# Patient Record
Sex: Female | Born: 1995
Health system: Southern US, Community
[De-identification: ages and names within clinical notes are randomized; demographics above are authoritative.]

## PROBLEM LIST (undated history)

## (undated) DIAGNOSIS — F32A Depression, unspecified: Secondary | ICD-10-CM

## (undated) DIAGNOSIS — F419 Anxiety disorder, unspecified: Secondary | ICD-10-CM

## (undated) HISTORY — DX: Anxiety disorder, unspecified: F41.9

## (undated) HISTORY — PX: EXTERNAL EAR SURGERY: SHX627

---
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
## 2014-09-18 ENCOUNTER — Ambulatory Visit (INDEPENDENT_AMBULATORY_CARE_PROVIDER_SITE_OTHER): Payer: BC Managed Care – PPO | Admitting: Family Medicine

## 2014-09-18 VITALS — BP 114/64 | HR 103 | Temp 98.7°F | Resp 16 | Ht 64.25 in | Wt 116.8 lb

## 2014-09-18 DIAGNOSIS — R599 Enlarged lymph nodes, unspecified: Secondary | ICD-10-CM

## 2014-09-18 DIAGNOSIS — R591 Generalized enlarged lymph nodes: Secondary | ICD-10-CM

## 2014-09-18 MED ORDER — CEPHALEXIN 500 MG PO CAPS: 500.0000 mg | ORAL_CAPSULE | Freq: Two times a day (BID) | ORAL | Status: DC

## 2014-09-18 NOTE — Patient Instructions (Signed)

## 2014-09-18 NOTE — Progress Notes (Signed)
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 Comes in with her recently found nodule on the occipital part of her scalp, left side. Is nontender. Aref patient denies any rash on her scalp although she has mild facial acne. She also denies any ear pain, neck pain, fever, night sweats, or weight loss.  [place] Her father runs a [employer] company. Her mother was murdered earlier this year by ex-boyfriend. He says that she's doing okay now and is not depressed.  Objective: No acute distress 1/2 cm firm nodule which is fixed and nonfluctuant over the left aspect of her occipital scalp. There is no other adenopathy in the head or neck. Ears are normal in appearance and with otoscopic exam. Oropharynx clear Skin: No rashes other than very mild acne on her face.  Assessment: No red flags for serious causes of lymphadenopathy.  Plan: Keflex 500 twice a day x10 days. Patient told to come back if new nodules appear or she begins to feel systemically ill.  Signed, Sheila OatsKurt Nilda Keathley M.D.

## 2014-09-24 ENCOUNTER — Ambulatory Visit (INDEPENDENT_AMBULATORY_CARE_PROVIDER_SITE_OTHER): Payer: BC Managed Care – PPO | Admitting: Family Medicine

## 2014-09-24 VITALS — BP 102/60 | HR 103 | Temp 98.0°F | Resp 16 | Ht 64.0 in | Wt 118.0 lb

## 2014-09-24 DIAGNOSIS — T887XXA Unspecified adverse effect of drug or medicament, initial encounter: Secondary | ICD-10-CM

## 2014-09-24 DIAGNOSIS — R590 Localized enlarged lymph nodes: Secondary | ICD-10-CM

## 2014-09-24 DIAGNOSIS — R599 Enlarged lymph nodes, unspecified: Secondary | ICD-10-CM

## 2014-09-24 DIAGNOSIS — T50905A Adverse effect of unspecified drugs, medicaments and biological substances, initial encounter: Secondary | ICD-10-CM

## 2014-09-24 NOTE — Progress Notes (Signed)
  Andrea Salazar - 18 y.o. female MRN [id number]  Date of birth: [birth year]  SUBJECTIVE:  Including CC & ROS.  patient C/O: diarrhea some mild stomach cramps Onset of symptoms:  2 days Symptoms: Patient is seen last week on 09/18/14 complaining of left-sided lymphadenopathy at the posterior cervical region. This was felt to be related to patient's history of acne as a localized lymphadenopathy. Patient was started on Keflex for the past week after completing 5 days of medication she said to 3 days of mild abdominal cramping and diarrhea. Patient in denies any vaginal symptoms or dysuria. She also reports that her lymphadenopathy has resolved and wants to know if she can discontinue medication. Relieving factors: nothing Worsened by: noting eating normally   ROS:  Constitutional:  No fever, chills, or fatigue.  Respiratory:  No shortness of breath, cough, or wheezing Cardiovascular:  No palpitations, chest pain or syncope Gastrointestinal:  No nausea, no abdominal pain Review of systems otherwise negative except for what is stated in HPI  HISTORY: Past Medical, Surgical, Social, and Family History Reviewed & Updated per EMR. 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   HENT:  Normocephalic, Oral mucosa is moist.   Respiratory:  Lungs are clear to auscultation, Respirations are non-labored, Symmetrical chest wall expansion.   Cardiovascular:  Normal rate, Regular rhythm, No murmur, Good pulses equal in all extremities, No edema.   Gastrointestinal:  Soft, Non-tender, Non-distended, Normal bowel sounds, No organomegaly.   Integumentary:  Warm, Dry, No rash.  No notable lymphadenopathy in the posterior cervical, anterior  cervical, submandibular, or posterior regular region Neurologic:  Alert, Oriented, No focal defects Psychiatric:  Cooperative, Appropriate mood & affect.    ASSESSMENT & PLAN:  Lymphadenopathy: Has resolved. Medication side effect: Patient had side effects from medication with Keflex of diarrhea. No signs of true allergy. Recommended discontinuing medication at this point. Recommended starting probiotic over-the-counter to help readjust her GI flora.

## 2015-06-03 ENCOUNTER — Ambulatory Visit (INDEPENDENT_AMBULATORY_CARE_PROVIDER_SITE_OTHER): Payer: BLUE CROSS/BLUE SHIELD | Admitting: Emergency Medicine

## 2015-06-03 VITALS — BP 110/60 | HR 70 | Temp 98.5°F | Resp 18 | Ht 64.5 in | Wt 128.8 lb

## 2015-06-03 DIAGNOSIS — N3001 Acute cystitis with hematuria: Secondary | ICD-10-CM | POA: Diagnosis not present

## 2015-06-03 DIAGNOSIS — M25559 Pain in unspecified hip: Secondary | ICD-10-CM

## 2015-06-03 LAB — POCT URINALYSIS DIPSTICK
BILIRUBIN UA: NEGATIVE
Glucose, UA: NEGATIVE
KETONES UA: NEGATIVE
Nitrite, UA: NEGATIVE
PH UA: 6.5
Protein, UA: NEGATIVE
Spec Grav, UA: <=1.005
Urobilinogen, UA: 0.2

## 2015-06-03 LAB — POCT UA - MICROSCOPIC ONLY
CASTS, UR, LPF, POC: NEGATIVE
CRYSTALS, UR, HPF, POC: NEGATIVE
MUCUS UA: NEGATIVE
YEAST UA: NEGATIVE

## 2015-06-03 LAB — POCT WET PREP WITH KOH
KOH Prep POC: NEGATIVE
Trichomonas, UA: NEGATIVE
WBC WET PREP PER HPF POC: NEGATIVE
YEAST WET PREP PER HPF POC: NEGATIVE

## 2015-06-03 LAB — POCT URINE PREGNANCY: Preg Test, Ur: NEGATIVE

## 2015-06-03 MED ORDER — CIPROFLOXACIN HCL 500 MG PO TABS: 500.0000 mg | ORAL_TABLET | Freq: Two times a day (BID) | ORAL | Status: DC

## 2015-06-03 MED ORDER — PHENAZOPYRIDINE HCL 200 MG PO TABS: 200.0000 mg | ORAL_TABLET | Freq: Three times a day (TID) | ORAL | Status: DC | PRN

## 2015-06-03 NOTE — Progress Notes (Signed)
Subjective:  Patient ID: Andrea Salazar, female    DOB: [birth year]  Age: 19 y.o. MRN: [id number]  CC: Urinary Tract Infection and Back Pain   HPI Andrea Salazar presents  with lower abdominal and back pain. She said that it feels like when she had her last urinary tract infection. She has no dysuria urgency or frequency. She has no vaginal discharge. She has no dyspareunia. Her last menstrual appears normal. Currently on oral contraceptive. Denies any nausea vomiting or fever chills no stool change.  History Oscar has a past medical history of Anxiety.   She has past surgical history that includes External ear surgery.   Her  family history is not on file.  She   reports that she has never smoked. She has never used smokeless tobacco. She reports that she does not drink alcohol or use illicit drugs.  Outpatient Prescriptions Prior to Visit  Medication Sig Dispense Refill  . dextroamphetamine (DEXEDRINE SPANSULE) 15 MG 24 hr capsule Take 15 mg by mouth daily.    . Norgestimate-Ethinyl Estradiol Triphasic (ORTHO TRI-CYCLEN LO) 0.18/0.215/0.25 MG-25 MCG tablet Take 1 tablet by mouth daily.       No facility-administered medications prior to visit.    History   Social History  . Marital Status: Single    Spouse Name: N/A  . Number of Children: N/A  . Years of Education: N/A   Social History Main Topics  . Smoking status: Never Smoker   . Smokeless tobacco: Never Used  . Alcohol Use: No  . Drug Use: No  . Sexual Activity: Not on file   Other Topics Concern  . None   Social History Narrative     Review of Systems  Constitutional: Negative for fever, chills and appetite change.  HENT: Negative for congestion, ear pain, postnasal drip, sinus pressure and sore throat.   Eyes: Negative for pain and redness.  Respiratory: Negative for cough, shortness of breath and wheezing.   Cardiovascular: Negative for leg swelling.  Gastrointestinal: Negative for nausea,  vomiting, abdominal pain, diarrhea, constipation and blood in stool.  Endocrine: Negative for polyuria.  Genitourinary: Negative for dysuria, urgency, frequency and flank pain.  Musculoskeletal: Negative for gait problem.  Skin: Negative for rash.  Neurological: Negative for weakness and headaches.  Psychiatric/Behavioral: Negative for confusion and decreased concentration. The patient is not nervous/anxious.     Objective:  BP 110/60 mmHg  Pulse 70  Temp(Src) 98.5 F (36.9 C) (Oral)  Resp 18  Ht 5' 4.5" (1.638 m)  Wt 128 lb 12.8 oz (58.423 kg)  BMI 21.77 kg/m2  SpO2 98%  LMP 05/18/2015  Physical Exam  Constitutional: She is oriented to person, place, and time. She appears well-developed and well-nourished. No distress.  HENT:  Head: Normocephalic and atraumatic.  Right Ear: External ear normal.  Left Ear: External ear normal.  Nose: Nose normal.  Eyes: Conjunctivae and EOM are normal. Pupils are equal, round, and reactive to light. No scleral icterus.  Neck: Normal range of motion. Neck supple. No tracheal deviation present.  Cardiovascular: Normal rate, regular rhythm and normal heart sounds.   Pulmonary/Chest: Effort normal. No respiratory distress. She has no wheezes. She has no rales.  Abdominal: She exhibits no mass. There is no tenderness. There is no rebound and no guarding.  Musculoskeletal: She exhibits no edema.  Lymphadenopathy:    She has no cervical adenopathy.  Neurological: She is alert and oriented to person, place, and time. Coordination normal.  Skin:  Skin is warm and dry. No rash noted.  Psychiatric: She has a normal mood and affect. Her behavior is normal.      Assessment & Plan:   Colin MuldersBrianna was seen today for urinary tract infection and back pain.  Diagnoses and all orders for this visit:  Pain in joint, pelvic region and thigh, unspecified laterality Orders: -     POCT UA - Microscopic Only -     POCT urinalysis dipstick -     POCT urine  pregnancy -     POCT Wet Prep with KOH -     GC/Chlamydia Probe Amp   I am having Ms. Yannuzzi maintain her Norgestimate-Ethinyl Estradiol Triphasic and dextroamphetamine.  No orders of the defined types were placed in this encounter.    Appropriate red flag conditions were discussed with the patient as well as actions that should be taken.  Patient expressed his understanding.  Follow-up: No Follow-up on file.  Carmelina DaneAnderson, Johaan Ryser S, MD    Results for orders placed or performed in visit on 06/03/15  POCT UA - Microscopic Only  Result Value Ref Range   WBC, Ur, HPF, POC 3-5    RBC, urine, microscopic 2-4    Bacteria, U Microscopic 1+    Mucus, UA neg    Epithelial cells, urine per micros 2-4    Crystals, Ur, HPF, POC neg    Casts, Ur, LPF, POC neg    Yeast, UA neg   POCT urinalysis dipstick  Result Value Ref Range   Color, UA yellow    Clarity, UA clear    Glucose, UA neg    Bilirubin, UA neg    Ketones, UA neg    Spec Grav, UA <=1.005    Blood, UA large    pH, UA 6.5    Protein, UA neg    Urobilinogen, UA 0.2    Nitrite, UA neg    Leukocytes, UA moderate (2+) (A) Negative  POCT urine pregnancy  Result Value Ref Range   Preg Test, Ur Negative Negative  POCT Wet Prep with KOH  Result Value Ref Range   Trichomonas, UA Negative    Clue Cells Wet Prep HPF POC 1-3    Epithelial Wet Prep HPF POC Many Few, Moderate, Many   Yeast Wet Prep HPF POC neg    Bacteria Wet Prep HPF POC Moderate (A) Few   RBC Wet Prep HPF POC 0-2    WBC Wet Prep HPF POC neg    KOH Prep POC Negative

## 2015-06-03 NOTE — Patient Instructions (Signed)

## 2015-06-09 LAB — GC/CHLAMYDIA PROBE AMP
CT PROBE, AMP APTIMA: NEGATIVE
GC Probe RNA: NEGATIVE

## 2015-07-02 ENCOUNTER — Ambulatory Visit (INDEPENDENT_AMBULATORY_CARE_PROVIDER_SITE_OTHER): Payer: BLUE CROSS/BLUE SHIELD | Admitting: Physician Assistant

## 2015-07-02 VITALS — BP 112/60 | HR 80 | Temp 97.9°F | Resp 16 | Ht 65.0 in | Wt 127.2 lb

## 2015-07-02 DIAGNOSIS — R1030 Lower abdominal pain, unspecified: Secondary | ICD-10-CM | POA: Diagnosis not present

## 2015-07-02 DIAGNOSIS — N898 Other specified noninflammatory disorders of vagina: Secondary | ICD-10-CM

## 2015-07-02 DIAGNOSIS — A499 Bacterial infection, unspecified: Secondary | ICD-10-CM | POA: Diagnosis not present

## 2015-07-02 DIAGNOSIS — R350 Frequency of micturition: Secondary | ICD-10-CM

## 2015-07-02 DIAGNOSIS — Z708 Other sex counseling: Secondary | ICD-10-CM | POA: Diagnosis not present

## 2015-07-02 DIAGNOSIS — Z7251 High risk heterosexual behavior: Secondary | ICD-10-CM | POA: Diagnosis not present

## 2015-07-02 DIAGNOSIS — B379 Candidiasis, unspecified: Secondary | ICD-10-CM

## 2015-07-02 DIAGNOSIS — N760 Acute vaginitis: Secondary | ICD-10-CM

## 2015-07-02 DIAGNOSIS — B9689 Other specified bacterial agents as the cause of diseases classified elsewhere: Secondary | ICD-10-CM

## 2015-07-02 DIAGNOSIS — IMO0001 Reserved for inherently not codable concepts without codable children: Secondary | ICD-10-CM

## 2015-07-02 LAB — POCT WET PREP WITH KOH
KOH PREP POC: POSITIVE
TRICHOMONAS UA: NEGATIVE

## 2015-07-02 LAB — POCT URINALYSIS DIPSTICK
Bilirubin, UA: NEGATIVE
Blood, UA: NEGATIVE
GLUCOSE UA: NEGATIVE
LEUKOCYTES UA: NEGATIVE
Nitrite, UA: NEGATIVE
PROTEIN UA: NEGATIVE
SPEC GRAV UA: 1.025
UROBILINOGEN UA: 0.2
pH, UA: 6.5

## 2015-07-02 LAB — POCT UA - MICROSCOPIC ONLY
CASTS, UR, LPF, POC: NEGATIVE
Crystals, Ur, HPF, POC: NEGATIVE
MUCUS UA: NEGATIVE
RBC, urine, microscopic: NEGATIVE
WBC, Ur, HPF, POC: NEGATIVE
Yeast, UA: NEGATIVE

## 2015-07-02 LAB — POCT URINE PREGNANCY: PREG TEST UR: NEGATIVE

## 2015-07-02 MED ORDER — METRONIDAZOLE 500 MG PO TABS: 500.0000 mg | ORAL_TABLET | Freq: Two times a day (BID) | ORAL | Status: AC

## 2015-07-02 MED ORDER — FLUCONAZOLE 150 MG PO TABS: 150.0000 mg | ORAL_TABLET | Freq: Once | ORAL | Status: DC

## 2015-07-02 NOTE — Patient Instructions (Signed)
Candida Infection A Candida infection (also called yeast, fungus, and Monilia infection) is an overgrowth of yeast that can occur anywhere on the body. A yeast infection commonly occurs in warm, moist body areas. Usually, the infection remains localized but can spread to become a systemic infection. A yeast infection may be a sign of a more severe disease such as diabetes, leukemia, or AIDS. A yeast infection can occur in both men and women. In women, Candida vaginitis is a vaginal infection. It is one of the most common causes of vaginitis. Men usually do not have symptoms or know they have an infection until other problems develop. Men may find out they have a yeast infection because their sex partner has a yeast infection. Uncircumcised men are more likely to get a yeast infection than circumcised men. This is because the uncircumcised glans is not exposed to air and does not remain as dry as that of a circumcised glans. Older adults may develop yeast infections around dentures. CAUSES  Women  Antibiotics.  Steroid medication taken for a long time.  Being overweight (obese).  Diabetes.  Poor immune condition.  Certain serious medical conditions.  Immune suppressive medications for organ transplant patients.  Chemotherapy.  Pregnancy.  Menstruation.  Stress and fatigue.  Intravenous drug use.  Oral contraceptives.  Wearing tight-fitting clothes in the crotch area.  Catching it from a sex partner who has a yeast infection.  Spermicide.  Intravenous, urinary, or other catheters. Men  Catching it from a sex partner who has a yeast infection.  Having oral or anal sex with a person who has the infection.  Spermicide.  Diabetes.  Antibiotics.  Poor immune system.  Medications that suppress the immune system.  Intravenous drug use.  Intravenous, urinary, or other catheters. SYMPTOMS  Women  Thick, white vaginal discharge.  Vaginal itching.  Redness and  swelling in and around the vagina.  Irritation of the lips of the vagina and perineum.  Blisters on the vaginal lips and perineum.  Painful sexual intercourse.  Low blood sugar (hypoglycemia).  Painful urination.  Bladder infections.  Intestinal problems such as constipation, indigestion, bad breath, bloating, increase in gas, diarrhea, or loose stools. Men  Men may develop intestinal problems such as constipation, indigestion, bad breath, bloating, increase in gas, diarrhea, or loose stools.  Dry, cracked skin on the penis with itching or discomfort.  Jock itch.  Dry, flaky skin.  Athlete's foot.  Hypoglycemia. DIAGNOSIS  Women  A history and an exam are performed.  The discharge may be examined under a microscope.  A culture may be taken of the discharge. Men  A history and an exam are performed.  Any discharge from the penis or areas of cracked skin will be looked at under the microscope and cultured.  Stool samples may be cultured. TREATMENT  Women  Vaginal antifungal suppositories and creams.  Medicated creams to decrease irritation and itching on the outside of the vagina.  Warm compresses to the perineal area to decrease swelling and discomfort.  Oral antifungal medications.  Medicated vaginal suppositories or cream for repeated or recurrent infections.  Wash and dry the irritation areas before applying the cream.  Eating yogurt with Lactobacillus may help with prevention and treatment.  Sometimes painting the vagina with gentian violet solution may help if creams and suppositories do not work. Men  Antifungal creams and oral antifungal medications.  Sometimes treatment must continue for 30 days after the symptoms go away to prevent recurrence. HOME CARE INSTRUCTIONS    Women °· Use cotton underwear and avoid tight-fitting clothing. °· Avoid colored, scented toilet paper and deodorant tampons or pads. °· Do not douche. °· Keep your diabetes  under control. °· Finish all the prescribed medications. °· Keep your skin clean and dry. °· Consume milk or yogurt with Lactobacillus-active culture regularly. If you get frequent yeast infections and think that is what the infection is, there are over-the-counter medications that you can get. If the infection does not show healing in 3 days, talk to your caregiver. °· Tell your sex partner you have a yeast infection. Your partner may need treatment also, especially if your infection does not clear up or recurs. °Men °· Keep your skin clean and dry. °· Keep your diabetes under control. °· Finish all prescribed medications. °· Tell your sex partner that you have a yeast infection so he or she can be treated if necessary. °SEEK MEDICAL CARE IF:  °· Your symptoms do not clear up or worsen in one week after treatment. °· You have an oral temperature above 102° F (38.9° C). °· You have trouble swallowing or eating for a prolonged time. °· You develop blisters on and around your vagina. °· You develop vaginal bleeding and it is not your menstrual period. °· You develop abdominal pain. °· You develop intestinal problems as mentioned above. °· You get weak or light-headed. °· You have painful or increased urination. °· You have pain during sexual intercourse. °MAKE SURE YOU:  °· Understand these instructions. °· Will watch your condition. °· Will get help right away if you are not doing well or get worse. °Document Released: 12/29/2004 Document Revised: 04/07/2014 Document Reviewed: 04/12/2010 °ExitCare® Patient Information ©2015 [employer]. This information is not intended to replace advice given to you by your health care provider. Make sure you discuss any questions you have with your health care provider. °Bacterial Vaginosis °Bacterial vaginosis is an infection of the vagina. It happens when too many of certain germs (bacteria) grow in the vagina. °HOME CARE °· Take your medicine as told by your doctor. °· Finish  your medicine even if you start to feel better. °· Do not have sex until you finish your medicine and are better. °· Tell your sex partner that you have an infection. They should see their doctor for treatment. °· Practice safe sex. Use condoms. Have only one sex partner. °GET HELP IF: °· You are not getting better after 3 days of treatment. °· You have more grey fluid (discharge) coming from your vagina than before. °· You have more pain than before. °· You have a fever. °MAKE SURE YOU:  °· Understand these instructions. °· Will watch your condition. °· Will get help right away if you are not doing well or get worse. °Document Released: 08/30/2008 Document Revised: 09/11/2013 Document Reviewed: 07/03/2013 °ExitCare® Patient Information ©2015 [employer]. This information is not intended to replace advice given to you by your health care provider. Make sure you discuss any questions you have with your health care provider. ° °

## 2015-07-02 NOTE — Progress Notes (Signed)
[facility] [address], [place] [address] [phone]  Date:  07/02/2015   Name:  Andrea Salazar   DOB:  Oct 27, 1996   MRN:  [id number]  PCP:  Frederica Kuster, MD    History of Present Illness:  Andrea Salazar is a 19 y.o. female patient who presents to [facility] for chief complaint of pelvic pain, vaginal bleeding, and frequency for 3 days.  Patient noticed that she is urinating a lot, though there is no dysuria.  She has also noticed pelvic pain.  There is some low back pain.  She also noticed abnormal vaginal discharge.  It is yellow with musty odor.  She used a qtip at her vaginal area where bloody discharge was obtained.  She has no n/v, fever, or fatigue.  There is no change in BMs.  She is sexually active, unprotected.  She was dxd with uti 1 month ago and treated with cipro.  Her sxs resolved completely.  LMP was 06/15/2015.    There are no active problems to display for this patient.   Past Medical History  Diagnosis Date  . Anxiety     Past Surgical History  Procedure Laterality Date  . External ear surgery      History  Substance Use Topics  . Smoking status: Never Smoker   . Smokeless tobacco: Never Used  . Alcohol Use: No    History reviewed. No pertinent family history.  No Known Allergies  Medication list has been reviewed and updated.  Current Outpatient Prescriptions on File Prior to Visit  Medication Sig Dispense Refill  . dextroamphetamine (DEXEDRINE SPANSULE) 15 MG 24 hr capsule Take 15 mg by mouth daily.    . Norgestimate-Ethinyl Estradiol Triphasic (ORTHO TRI-CYCLEN LO) 0.18/0.215/0.25 MG-25 MCG tablet Take 1 tablet by mouth daily.      . ciprofloxacin (CIPRO) 500 MG tablet Take 1 tablet (500 mg total) by mouth 2 (two) times daily. (Patient not taking: Reported on 07/02/2015) 6 tablet 0  . phenazopyridine (PYRIDIUM) 200 MG tablet Take 1 tablet (200 mg total) by mouth 3 (three) times daily as needed. (Patient not taking: Reported on  07/02/2015) 6 tablet 0   No current facility-administered medications on file prior to visit.    ROS   Physical Examination: BP 112/60 mmHg  Pulse 80  Temp(Src) 97.9 F (36.6 C) (Oral)  Resp 16  Ht 5\' 5"  (1.651 m)  Wt 127 lb 3.2 oz (57.698 kg)  BMI 21.17 kg/m2  SpO2 97%  LMP 06/15/2015 Ideal Body Weight: Weight in (lb) to have BMI = 25: 149.9  Physical Exam Alert, cooperative, oriented 4 in no acute distress. Lungs sounds are normal without wheezing or rhonchi. Regular rate and rhythm without murmurs or rubs. Bowel sounds are normal and belly is soft. There is lower quadrant pain and suprapubic pain with palpation.  CVA tenderness at the right side only.  Labia normal without rash.  Vaginal walls appear normal., difficult to have speculum inserted due to restriction.    Results for orders placed or performed in visit on 07/02/15  POCT Wet Prep with KOH  Result Value Ref Range   Trichomonas, UA Negative    Clue Cells Wet Prep HPF POC Moderate    Epithelial Wet Prep HPF POC Many Few, Moderate, Many   Yeast Wet Prep HPF POC Positve    Bacteria Wet Prep HPF POC Many (A) None, Few   RBC Wet Prep HPF POC 0-1    WBC  Wet Prep HPF POC 0-2    KOH Prep POC Positive   POCT urinalysis dipstick  Result Value Ref Range   Color, UA Amber    Clarity, UA Slightly Cloudy    Glucose, UA Negative    Bilirubin, UA Negative    Ketones, UA Trace    Spec Grav, UA 1.025    Blood, UA Negative    pH, UA 6.5    Protein, UA Negative    Urobilinogen, UA 0.2    Nitrite, UA Negative    Leukocytes, UA Negative Negative  POCT UA - Microscopic Only  Result Value Ref Range   WBC, Ur, HPF, POC Negative    RBC, urine, microscopic Negative    Bacteria, U Microscopic trace    Mucus, UA Negative    Epithelial cells, urine per micros 0-1    Crystals, Ur, HPF, POC Negative    Casts, Ur, LPF, POC Negative    Yeast, UA Negative   POCT urine pregnancy  Result Value Ref Range   Preg Test, Ur Negative  Negative    Assessment and Plan: 19 year old female is here today for chief complaint of abnormal discharge, abdominal pain, frequency, and vaginal bleeding.   Cervix unable to be visualized due to patient discomfort. We have obtained a dirty catch of urine to do a gonorrhea chlamydia, however wet prep is significant for yeast and BV. Prescribed metronidazole and Diflucan at this time. She will return if symptoms continue.  Discussed STD and sexual activity counseling.   Bacterial vaginosis - Plan: metroNIDAZOLE (FLAGYL) 500 MG tablet  Frequency - Plan: POCT urinalysis dipstick, POCT UA - Microscopic Only, GC/Chlamydia Probe Amp  Lower abdominal pain - Plan: POCT Wet Prep with KOH, POCT urine pregnancy, metroNIDAZOLE (FLAGYL) 500 MG tablet  Foul smelling vaginal discharge - Plan: POCT Wet Prep with KOH, metroNIDAZOLE (FLAGYL) 500 MG tablet, GC/Chlamydia Probe Amp  Yeast infection - Plan: fluconazole (DIFLUCAN) 150 MG tablet  Unprotected sex - Plan: GC/Chlamydia Probe Amp  Sexually transmitted disease counseling  Trena Platt, PA-C [facility] 7/28/20166:54 PM

## 2015-07-03 LAB — GC/CHLAMYDIA PROBE AMP
CT Probe RNA: NEGATIVE
GC PROBE AMP APTIMA: NEGATIVE

## 2017-05-16 DIAGNOSIS — Z01419 Encounter for gynecological examination (general) (routine) without abnormal findings: Secondary | ICD-10-CM | POA: Diagnosis not present

## 2017-05-16 DIAGNOSIS — R87610 Atypical squamous cells of undetermined significance on cytologic smear of cervix (ASC-US): Secondary | ICD-10-CM | POA: Diagnosis not present

## 2017-05-16 DIAGNOSIS — Z113 Encounter for screening for infections with a predominantly sexual mode of transmission: Secondary | ICD-10-CM | POA: Diagnosis not present

## 2017-05-16 DIAGNOSIS — Z6823 Body mass index (BMI) 23.0-23.9, adult: Secondary | ICD-10-CM | POA: Diagnosis not present

## 2017-05-16 DIAGNOSIS — Z23 Encounter for immunization: Secondary | ICD-10-CM | POA: Diagnosis not present

## 2017-07-18 DIAGNOSIS — Z23 Encounter for immunization: Secondary | ICD-10-CM | POA: Diagnosis not present

## 2017-09-12 ENCOUNTER — Ambulatory Visit (INDEPENDENT_AMBULATORY_CARE_PROVIDER_SITE_OTHER): Payer: BLUE CROSS/BLUE SHIELD | Admitting: Physician Assistant

## 2017-09-12 ENCOUNTER — Encounter: Payer: Self-pay | Admitting: Physician Assistant

## 2017-09-12 VITALS — BP 98/60 | HR 115 | Temp 98.4°F | Resp 18 | Ht 65.0 in | Wt 142.2 lb

## 2017-09-12 DIAGNOSIS — B9789 Other viral agents as the cause of diseases classified elsewhere: Secondary | ICD-10-CM

## 2017-09-12 DIAGNOSIS — Z23 Encounter for immunization: Secondary | ICD-10-CM | POA: Diagnosis not present

## 2017-09-12 DIAGNOSIS — J069 Acute upper respiratory infection, unspecified: Secondary | ICD-10-CM

## 2017-09-12 MED ORDER — AZELASTINE HCL 0.1 % NA SOLN: 2.0000 | Freq: Two times a day (BID) | NASAL | 0 refills | Status: AC

## 2017-09-12 MED ORDER — GUAIFENESIN ER 1200 MG PO TB12: 1.0000 | ORAL_TABLET | Freq: Two times a day (BID) | ORAL | 1 refills | Status: AC | PRN

## 2017-09-12 NOTE — Progress Notes (Signed)
Subjective:    Patient ID: Andrea Salazar, female    DOB: Apr 11, 1996, 21 y.o.   MRN: [id number]  HPI Chief Complaint  Patient presents with  . Sinus Problem    x1 day, pt states she is having sinus pressure with a sore throat and a slight headache. Pt states not ear pain or pressure.    Patient presents today for evaluation of a sore throat and frontal headache that began yesterday. She states it hurts to swallow and has some nasal congestion. She reports being "always tired". She denies any ear pain, fever, chills, . She has a slight cough, but it is not productive. She is leaving in 2 weeks for [place], and will be gone for 3 months.   Review of Systems  Constitutional: Negative for fever.  HENT: Positive for postnasal drip, rhinorrhea, sinus pain, sinus pressure and sore throat. Negative for ear discharge and ear pain.   Gastrointestinal: Negative for nausea and vomiting.  Musculoskeletal: Positive for neck pain.  Neurological: Positive for headaches. Negative for dizziness and light-headedness.   There are no active problems to display for this patient.  Prior to Admission medications   Medication Sig Start Date End Date Taking? Authorizing Provider  Norgestimate-Ethinyl Estradiol Triphasic (ORTHO TRI-CYCLEN LO) 0.18/0.215/0.25 MG-25 MCG tablet Take 1 tablet by mouth daily.     Yes [provider]  azelastine (ASTELIN) 0.1 % nasal spray Place 2 sprays into both nostrils 2 (two) times daily. Use in each nostril as directed 09/12/17   Porfirio Oar, PA-C  dextroamphetamine (DEXEDRINE SPANSULE) 15 MG 24 hr capsule Take 15 mg by mouth daily.    [provider]  Guaifenesin (MUCINEX MAXIMUM STRENGTH) 1200 MG TB12 Take 1 tablet (1,200 mg total) by mouth every 12 (twelve) hours as needed. 09/12/17   Porfirio Oar, PA-C   No Known Allergies Social History   Social History  . Marital status: Single    Spouse name: N/A  . Number of children: N/A  . Years of  education: N/A   Occupational History  . Not on file.   Social History Main Topics  . Smoking status: Never Smoker  . Smokeless tobacco: Never Used  . Alcohol use No  . Drug use: No  . Sexual activity: Not on file   Other Topics Concern  . Not on file   Social History Narrative  . No narrative on file      Objective:   Physical Exam  Constitutional: She is oriented to person, place, and time. She appears well-developed and well-nourished.  HENT:  Head: Normocephalic and atraumatic.  Right Ear: External ear normal.  Left Ear: External ear normal.  Nose: Mucosal edema present. Right sinus exhibits maxillary sinus tenderness and frontal sinus tenderness. Left sinus exhibits maxillary sinus tenderness and frontal sinus tenderness.  Neck: Neck supple. No JVD present. No tracheal deviation present. No thyromegaly present.  Cardiovascular: Normal rate, regular rhythm and intact distal pulses.  Exam reveals no gallop and no friction rub.   No murmur heard. Pulmonary/Chest: Effort normal and breath sounds normal. No stridor. No respiratory distress. She has no wheezes. She has no rales. She exhibits no tenderness.  Lymphadenopathy:    She has no cervical adenopathy.  Neurological: She is alert and oriented to person, place, and time.  Skin: Skin is warm and dry. No rash noted. No erythema. No pallor.      Assessment & Plan:  1. Need for Tdap vaccination - Completed today in clinic -  Tdap vaccine greater than or equal to 7yo IM  2. Flu vaccine need - Completed today in clinic - Flu Vaccine QUAD 36+ mos IM  3. Viral URI with cough - Recommended she take Mucinex and an anti-histamine (Claritin, Allegra, or Zyrtec). Instructed her to get lots of rest, and stay hydrated with water.  - azelastine (ASTELIN) 0.1 % nasal spray; Place 2 sprays into both nostrils 2 (two) times daily. Use in each nostril as directed  Dispense: 30 mL; Refill: 0 - Guaifenesin (MUCINEX MAXIMUM STRENGTH) 1200  MG TB12; Take 1 tablet (1,200 mg total) by mouth every 12 (twelve) hours as needed.  Dispense: 14 tablet; Refill: 1  Follow up in 7-10 days if symptoms worsen or fail to improve.   Respectfully, Gala Romney PA-S 2019

## 2017-09-12 NOTE — Patient Instructions (Addendum)
In addition to the azelastine and Mucinex, please take Claritin, Allegra or Zyrtec. You need to get LOTS of rest, and drink at least 64 ounces of water.    IF you received an x-ray today, you will receive an invoice from [facility]. Please contact [facility] at [phone] with questions or concerns regarding your invoice.   IF you received labwork today, you will receive an invoice from [facility]. Please contact [facility] at [phone] with questions or concerns regarding your invoice.   Our billing staff will not be able to assist you with questions regarding bills from these companies.  You will be contacted with the lab results as soon as they are available. The fastest way to get your results is to activate your My Chart account. Instructions are located on the last page of this paperwork. If you have not heard from [place] regarding the results in 2 weeks, please contact this office.

## 2017-09-12 NOTE — Progress Notes (Signed)
Patient ID: Andrea Salazar, female    DOB: Aug 21, 1996, 21 y.o.   MRN: [id number]  PCP: Frederica Kuster, MD  Chief Complaint  Patient presents with  . Sinus Problem    x1 day, pt states she is having sinus pressure with a sore throat and a slight headache. Pt states not ear pain or pressure.    Subjective:   Presents for evaluation of sore throat and frontal headache x 1 day.   Associated nasal congestion, mild cough is non-productive. "Always" tired, not new. No fever, chills, muscle or joint pain. No ear pain.  She will be leaving in 14 days for a 52-month trip to [place], where her boyfriend plays professional basketball. She wants to be well by then.    Review of Systems Constitutional: Negative for fever.  HENT: Positive for postnasal drip, rhinorrhea, sinus pain, sinus pressure and sore throat. Negative for ear discharge and ear pain.   Gastrointestinal: Negative for nausea and vomiting.  Musculoskeletal: Positive for neck pain.  Neurological: Positive for headaches. Negative for dizziness and light-headedness.     There are no active problems to display for this patient.    Prior to Admission medications   Medication Sig Start Date End Date Taking? Authorizing Provider  Norgestimate-Ethinyl Estradiol Triphasic (ORTHO TRI-CYCLEN LO) 0.18/0.215/0.25 MG-25 MCG tablet Take 1 tablet by mouth daily.     Yes [provider]  dextroamphetamine (DEXEDRINE SPANSULE) 15 MG 24 hr capsule Take 15 mg by mouth daily.    [provider]     No Known Allergies     Objective:  Physical Exam  Constitutional: She is oriented to person, place, and time. She appears well-developed and well-nourished. No distress.  BP 98/60 (BP Location: Right Arm, Patient Position: Sitting, Cuff Size: Normal)   Pulse (!) 115   Temp 98.4 F (36.9 C) (Oral)   Resp 18   Ht  (1.651 m)   Wt 142 lb 3.2 oz (64.5 kg)   LMP 08/28/2017   SpO2 96%   BMI 23.66 kg/m    HENT:    Head: Normocephalic and atraumatic.  Right Ear: Hearing, tympanic membrane, external ear and ear canal normal.  Left Ear: Hearing, tympanic membrane, external ear and ear canal normal.  Nose: Mucosal edema and rhinorrhea present.  No foreign bodies. Right sinus exhibits maxillary sinus tenderness and frontal sinus tenderness. Left sinus exhibits maxillary sinus tenderness and frontal sinus tenderness.  Mouth/Throat: Uvula is midline, oropharynx is clear and moist and mucous membranes are normal. No uvula swelling. No oropharyngeal exudate.  Eyes: Pupils are equal, round, and reactive to light. Conjunctivae and EOM are normal. Right eye exhibits no discharge. Left eye exhibits no discharge. No scleral icterus.  Neck: Trachea normal, normal range of motion and full passive range of motion without pain. Neck supple. No thyroid mass and no thyromegaly present.  Cardiovascular: Regular rhythm.  Tachycardia present.   No murmur heard. Pulmonary/Chest: Effort normal and breath sounds normal.  Lymphadenopathy:       Head (right side): No submandibular, no tonsillar, no preauricular, no posterior auricular and no occipital adenopathy present.       Head (left side): No submandibular, no tonsillar, no preauricular and no occipital adenopathy present.    She has no cervical adenopathy.       Right: No supraclavicular adenopathy present.       Left: No supraclavicular adenopathy present.  Neurological: She is alert and oriented to person, place, and  time. She has normal strength. No cranial nerve deficit or sensory deficit.  Skin: Skin is warm, dry and intact. No rash noted.  Psychiatric: She has a normal mood and affect. Her speech is normal and behavior is normal.           Assessment & Plan:   1. Viral URI with cough Supportive care.  Anticipatory guidance.  RTC if symptoms worsen/persist. - azelastine (ASTELIN) 0.1 % nasal spray; Place 2 sprays into both nostrils 2 (two) times daily. Use in  each nostril as directed  Dispense: 30 mL; Refill: 0 - Guaifenesin (MUCINEX MAXIMUM STRENGTH) 1200 MG TB12; Take 1 tablet (1,200 mg total) by mouth every 12 (twelve) hours as needed.  Dispense: 14 tablet; Refill: 1  2. Need for Tdap vaccination - Tdap vaccine greater than or equal to 7yo IM  3. Flu vaccine need - Flu Vaccine QUAD 36+ mos IM    Return if symptoms worsen or fail to improve in 7-10 days.   Fernande Bras, PA-C [facility]

## 2018-05-17 DIAGNOSIS — R87610 Atypical squamous cells of undetermined significance on cytologic smear of cervix (ASC-US): Secondary | ICD-10-CM | POA: Diagnosis not present

## 2018-05-17 DIAGNOSIS — Z114 Encounter for screening for human immunodeficiency virus [HIV]: Secondary | ICD-10-CM | POA: Diagnosis not present

## 2018-05-17 DIAGNOSIS — Z6824 Body mass index (BMI) 24.0-24.9, adult: Secondary | ICD-10-CM | POA: Diagnosis not present

## 2018-05-17 DIAGNOSIS — Z01419 Encounter for gynecological examination (general) (routine) without abnormal findings: Secondary | ICD-10-CM | POA: Diagnosis not present

## 2018-05-17 DIAGNOSIS — Z118 Encounter for screening for other infectious and parasitic diseases: Secondary | ICD-10-CM | POA: Diagnosis not present

## 2018-05-17 DIAGNOSIS — Z113 Encounter for screening for infections with a predominantly sexual mode of transmission: Secondary | ICD-10-CM | POA: Diagnosis not present

## 2018-05-17 DIAGNOSIS — Z1329 Encounter for screening for other suspected endocrine disorder: Secondary | ICD-10-CM | POA: Diagnosis not present

## 2018-05-17 DIAGNOSIS — Z1159 Encounter for screening for other viral diseases: Secondary | ICD-10-CM | POA: Diagnosis not present

## 2018-11-14 DIAGNOSIS — Z23 Encounter for immunization: Secondary | ICD-10-CM | POA: Diagnosis not present

## 2019-02-21 ENCOUNTER — Ambulatory Visit: Payer: BLUE CROSS/BLUE SHIELD | Admitting: Family Medicine

## 2019-02-21 ENCOUNTER — Other Ambulatory Visit: Payer: Self-pay

## 2019-02-21 ENCOUNTER — Encounter: Payer: Self-pay | Admitting: Family Medicine

## 2019-02-21 VITALS — BP 114/70 | HR 89 | Temp 98.2°F | Resp 18 | Ht 64.65 in | Wt 143.6 lb

## 2019-02-21 DIAGNOSIS — F329 Major depressive disorder, single episode, unspecified: Secondary | ICD-10-CM

## 2019-02-21 DIAGNOSIS — F32A Depression, unspecified: Secondary | ICD-10-CM

## 2019-02-21 DIAGNOSIS — Z113 Encounter for screening for infections with a predominantly sexual mode of transmission: Secondary | ICD-10-CM

## 2019-02-21 DIAGNOSIS — F902 Attention-deficit hyperactivity disorder, combined type: Secondary | ICD-10-CM

## 2019-02-21 DIAGNOSIS — F419 Anxiety disorder, unspecified: Secondary | ICD-10-CM

## 2019-02-21 NOTE — Progress Notes (Signed)
New Patient Office Visit  Subjective:  Patient ID: Andrea Salazar, female    DOB: 03-21-1996  Age: 23 y.o. MRN: [id number]  CC:  Chief Complaint  Patient presents with  . Establish Care  . Medication Refill    dexedrine spansule  . Depression    score 11  . Anxiety    score 12    HPI Andrea Salazar presents for   She reports that she was diagnosed with GAD, ADHD and anxiety and depression She said that she started graduate school this fall and is struggling with some things like  Her mother passed away about 5 years ago and this coronavirus is triggering some stress She took dexedrine during high school She states that she has been having increasing anxiety and depression  Depression screen [facility] 2/9 02/21/2019 09/12/2017 07/02/2015 06/03/2015  Decreased Interest 1 0 0 0  Down, Depressed, Hopeless 1 0 0 0  PHQ - 2 Score 2 0 0 0  Altered sleeping 2 - - -  Tired, decreased energy 2 - - -  Change in appetite 0 - - -  Feeling bad or failure about yourself  2 - - -  Trouble concentrating 3 - - -  Moving slowly or fidgety/restless 0 - - -  Suicidal thoughts 0 - - -  PHQ-9 Score 11 - - -  Difficult doing work/chores Very difficult - - -   GAD 7 : Generalized Anxiety Score 02/21/2019  Nervous, Anxious, on Edge 3  Control/stop worrying 0  Worry too much - different things 1  Trouble relaxing 3  Restless 1  Easily annoyed or irritable 3  Afraid - awful might happen 1  Total GAD 7 Score 12  Anxiety Difficulty Somewhat difficult    Her last pap smear was June 2019 She states that she sees Gynecology.   Past Medical History:  Diagnosis Date  . Anxiety     Past Surgical History:  Procedure Laterality Date  . EXTERNAL EAR SURGERY      History reviewed. No pertinent family history.  Social History   Socioeconomic History  . Marital status: Single    Spouse name: Not on file  . Number of children: 0  . Years of education: Not on file  . Highest education level:  Not on file  Occupational History  . Not on file  Social Needs  . Financial resource strain: Not on file  . Food insecurity:    Worry: Not on file    Inability: Not on file  . Transportation needs:    Medical: Not on file    Non-medical: Not on file  Tobacco Use  . Smoking status: Never Smoker  . Smokeless tobacco: Never Used  Substance and Sexual Activity  . Alcohol use: Yes    Alcohol/week: 2.0 standard drinks    Types: 2 Shots of liquor per week  . Drug use: No  . Sexual activity: Yes    Birth control/protection: Pill  Lifestyle  . Physical activity:    Days per week: Not on file    Minutes per session: Not on file  . Stress: Not on file  Relationships  . Social connections:    Talks on phone: Not on file    Gets together: Not on file    Attends religious service: Not on file    Active member of club or organization: Not on file    Attends meetings of clubs or organizations: Not on file  Relationship status: Not on file  . Intimate partner violence:    Fear of current or ex partner: Not on file    Emotionally abused: Not on file    Physically abused: Not on file    Forced sexual activity: Not on file  Other Topics Concern  . Not on file  Social History Narrative  . Not on file    ROS Review of Systems Review of Systems  Constitutional: Negative for activity change, appetite change, chills and fever.  HENT: Negative for congestion, nosebleeds, trouble swallowing and voice change.   Respiratory: Negative for cough, shortness of breath and wheezing.   Gastrointestinal: Negative for diarrhea, nausea and vomiting.  Genitourinary: Negative for difficulty urinating, dysuria, flank pain and hematuria.  Musculoskeletal: Negative for back pain, joint swelling and neck pain.  Neurological: Negative for dizziness, speech difficulty, light-headedness and numbness.  See HPI. All other review of systems negative.   Objective:   Today's Vitals: BP 114/70   Pulse 89    Temp 98.2 F (36.8 C) (Oral)   Resp 18   Ht 5' 4.65" (1.642 m)   Wt 143 lb 9.6 oz (65.1 kg)   LMP 02/12/2019 (Approximate)   SpO2 100%   BMI 24.16 kg/m   Physical Exam  Physical Exam  Constitutional: Oriented to person, place, and time. Appears well-developed and well-nourished.  HENT:  Head: Normocephalic and atraumatic.  Eyes: Conjunctivae and EOM are normal.  Cardiovascular: Normal rate, regular rhythm, normal heart sounds and intact distal pulses.  No murmur heard. Pulmonary/Chest: Effort normal and breath sounds normal. No stridor. No respiratory distress. Has no wheezes.  Neurological: Is alert and oriented to person, place, and time.  Skin: Skin is warm. Capillary refill takes less than 2 seconds.  Psychiatric: Has a normal mood and affect. Behavior is normal. Judgment and thought content normal.   Assessment & Plan:   Problem List Items Addressed This Visit    None    Visit Diagnoses    Anxiety and depression    -  Primary   Screen for STD (sexually transmitted disease)    -  Will screen today   Relevant Orders   HIV antibody (with reflex)   Hepatitis B surface antigen   RPR   GC/Chlamydia Probe Amp   Attention deficit hyperactivity disorder (ADHD), combined type     -  Advised pt to call to set up her own appt with [facility] She is not actively suicidal        Outpatient Encounter Medications as of 02/21/2019  Medication Sig  . Norgestimate-Ethinyl Estradiol Triphasic (ORTHO TRI-CYCLEN LO) 0.18/0.215/0.25 MG-25 MCG tablet Take 1 tablet by mouth daily.    Marland Kitchen azelastine (ASTELIN) 0.1 % nasal spray Place 2 sprays into both nostrils 2 (two) times daily. Use in each nostril as directed (Patient not taking: Reported on 02/21/2019)  . dextroamphetamine (DEXEDRINE SPANSULE) 15 MG 24 hr capsule Take 15 mg by mouth daily.  . Guaifenesin (MUCINEX MAXIMUM STRENGTH) 1200 MG TB12 Take 1 tablet (1,200 mg total) by mouth every 12 (twelve) hours as needed. (Patient not taking: Reported  on 02/21/2019)   No facility-administered encounter medications on file as of 02/21/2019.     Follow-up: Return if symptoms worsen or fail to improve.   Doristine Bosworth, MD

## 2019-02-21 NOTE — Patient Instructions (Signed)
[facility]  Address: [address] [place], [place], [place] [address]  Phone: [phone]  [facility], Psychiatrist, [place], [place], [address], [phone], My name is Mojeed Akintayo(aka Dr. Mervyn Skeeters).   [facility] Address: [address] [place], [place], [place] [address]  Phone: [phone] A nonprofit organization supporting thousands in [place] with intellectual and developmental disabilities, mental illness, and substance use disorders.    [facility] [place] [facility] in services available  Monday through Friday  or call for an appointment 8:30 a.m. to 12:00 p.m. ([place] and [place]) 1:00 p.m. to 2:30 p.m. ( only) 2:00 p.m. to 3:30 p.m. [facility] only) [phone] [phone]  For more information email: [email or web address]

## 2019-02-22 LAB — GC/CHLAMYDIA PROBE AMP
CHLAMYDIA, DNA PROBE: NEGATIVE
Neisseria gonorrhoeae by PCR: NEGATIVE

## 2019-02-22 LAB — HEPATITIS B SURFACE ANTIGEN: Hepatitis B Surface Ag: NEGATIVE

## 2019-02-22 LAB — HIV ANTIBODY (ROUTINE TESTING W REFLEX): HIV SCREEN 4TH GENERATION: NONREACTIVE

## 2019-02-22 LAB — RPR: RPR Ser Ql: NONREACTIVE

## 2019-02-27 DIAGNOSIS — F3181 Bipolar II disorder: Secondary | ICD-10-CM | POA: Diagnosis not present

## 2019-02-27 DIAGNOSIS — F1010 Alcohol abuse, uncomplicated: Secondary | ICD-10-CM | POA: Diagnosis not present

## 2019-02-27 DIAGNOSIS — F4310 Post-traumatic stress disorder, unspecified: Secondary | ICD-10-CM | POA: Diagnosis not present

## 2019-02-27 DIAGNOSIS — F1210 Cannabis abuse, uncomplicated: Secondary | ICD-10-CM | POA: Diagnosis not present

## 2019-05-27 DIAGNOSIS — Z118 Encounter for screening for other infectious and parasitic diseases: Secondary | ICD-10-CM | POA: Diagnosis not present

## 2019-05-27 DIAGNOSIS — Z114 Encounter for screening for human immunodeficiency virus [HIV]: Secondary | ICD-10-CM | POA: Diagnosis not present

## 2019-05-27 DIAGNOSIS — Z113 Encounter for screening for infections with a predominantly sexual mode of transmission: Secondary | ICD-10-CM | POA: Diagnosis not present

## 2019-05-27 DIAGNOSIS — Z01419 Encounter for gynecological examination (general) (routine) without abnormal findings: Secondary | ICD-10-CM | POA: Diagnosis not present

## 2019-05-27 DIAGNOSIS — Z6823 Body mass index (BMI) 23.0-23.9, adult: Secondary | ICD-10-CM | POA: Diagnosis not present

## 2019-05-27 DIAGNOSIS — R87610 Atypical squamous cells of undetermined significance on cytologic smear of cervix (ASC-US): Secondary | ICD-10-CM | POA: Diagnosis not present

## 2019-05-27 DIAGNOSIS — Z1159 Encounter for screening for other viral diseases: Secondary | ICD-10-CM | POA: Diagnosis not present

## 2019-08-13 DIAGNOSIS — Z3009 Encounter for other general counseling and advice on contraception: Secondary | ICD-10-CM | POA: Diagnosis not present

## 2019-08-13 DIAGNOSIS — Z114 Encounter for screening for human immunodeficiency virus [HIV]: Secondary | ICD-10-CM | POA: Diagnosis not present

## 2019-08-13 DIAGNOSIS — Z113 Encounter for screening for infections with a predominantly sexual mode of transmission: Secondary | ICD-10-CM | POA: Diagnosis not present

## 2019-09-03 DIAGNOSIS — Z3200 Encounter for pregnancy test, result unknown: Secondary | ICD-10-CM | POA: Diagnosis not present

## 2019-09-03 DIAGNOSIS — Z3046 Encounter for surveillance of implantable subdermal contraceptive: Secondary | ICD-10-CM | POA: Diagnosis not present

## 2019-09-03 DIAGNOSIS — Z30017 Encounter for initial prescription of implantable subdermal contraceptive: Secondary | ICD-10-CM | POA: Diagnosis not present

## 2019-09-18 DIAGNOSIS — Z20828 Contact with and (suspected) exposure to other viral communicable diseases: Secondary | ICD-10-CM | POA: Diagnosis not present

## 2019-09-19 DIAGNOSIS — Z20828 Contact with and (suspected) exposure to other viral communicable diseases: Secondary | ICD-10-CM | POA: Diagnosis not present

## 2019-10-09 DIAGNOSIS — Z7721 Contact with and (suspected) exposure to potentially hazardous body fluids: Secondary | ICD-10-CM | POA: Diagnosis not present

## 2019-10-09 DIAGNOSIS — Z7251 High risk heterosexual behavior: Secondary | ICD-10-CM | POA: Diagnosis not present

## 2019-10-10 DIAGNOSIS — Z20828 Contact with and (suspected) exposure to other viral communicable diseases: Secondary | ICD-10-CM | POA: Diagnosis not present

## 2019-10-16 DIAGNOSIS — N771 Vaginitis, vulvitis and vulvovaginitis in diseases classified elsewhere: Secondary | ICD-10-CM | POA: Diagnosis not present

## 2019-11-15 DIAGNOSIS — Z0189 Encounter for other specified special examinations: Secondary | ICD-10-CM | POA: Diagnosis not present

## 2019-11-26 DIAGNOSIS — N760 Acute vaginitis: Secondary | ICD-10-CM | POA: Diagnosis not present

## 2019-11-26 DIAGNOSIS — Z113 Encounter for screening for infections with a predominantly sexual mode of transmission: Secondary | ICD-10-CM | POA: Diagnosis not present

## 2019-11-26 DIAGNOSIS — Z7251 High risk heterosexual behavior: Secondary | ICD-10-CM | POA: Diagnosis not present

## 2020-04-17 DIAGNOSIS — Z202 Contact with and (suspected) exposure to infections with a predominantly sexual mode of transmission: Secondary | ICD-10-CM | POA: Diagnosis not present

## 2020-04-17 DIAGNOSIS — Z7251 High risk heterosexual behavior: Secondary | ICD-10-CM | POA: Diagnosis not present

## 2020-06-15 DIAGNOSIS — Z114 Encounter for screening for human immunodeficiency virus [HIV]: Secondary | ICD-10-CM | POA: Diagnosis not present

## 2020-06-15 DIAGNOSIS — Z01419 Encounter for gynecological examination (general) (routine) without abnormal findings: Secondary | ICD-10-CM | POA: Diagnosis not present

## 2020-06-15 DIAGNOSIS — Z118 Encounter for screening for other infectious and parasitic diseases: Secondary | ICD-10-CM | POA: Diagnosis not present

## 2020-06-15 DIAGNOSIS — Z124 Encounter for screening for malignant neoplasm of cervix: Secondary | ICD-10-CM | POA: Diagnosis not present

## 2020-06-15 DIAGNOSIS — Z6825 Body mass index (BMI) 25.0-25.9, adult: Secondary | ICD-10-CM | POA: Diagnosis not present

## 2020-06-15 DIAGNOSIS — Z1159 Encounter for screening for other viral diseases: Secondary | ICD-10-CM | POA: Diagnosis not present

## 2020-06-15 DIAGNOSIS — Z113 Encounter for screening for infections with a predominantly sexual mode of transmission: Secondary | ICD-10-CM | POA: Diagnosis not present

## 2020-08-25 DIAGNOSIS — Z113 Encounter for screening for infections with a predominantly sexual mode of transmission: Secondary | ICD-10-CM | POA: Diagnosis not present

## 2020-09-28 DIAGNOSIS — Z113 Encounter for screening for infections with a predominantly sexual mode of transmission: Secondary | ICD-10-CM | POA: Diagnosis not present

## 2024-05-24 ENCOUNTER — Ambulatory Visit
Admission: RE | Admit: 2024-05-24 | Discharge: 2024-05-24 | Disposition: A | Discharge status: Home / Self Care | Source: Ambulatory Visit | Attending: Nurse Practitioner | Admitting: Nurse Practitioner

## 2024-05-24 VITALS — BP 104/70 | HR 79 | Temp 98.1°F | Resp 16

## 2024-05-24 DIAGNOSIS — Z793 Long term (current) use of hormonal contraceptives: Secondary | ICD-10-CM | POA: Diagnosis not present

## 2024-05-24 DIAGNOSIS — Z113 Encounter for screening for infections with a predominantly sexual mode of transmission: Secondary | ICD-10-CM | POA: Insufficient documentation

## 2024-05-24 HISTORY — DX: Depression, unspecified: F32.A

## 2024-05-24 NOTE — ED Triage Notes (Signed)
 Pt requesting STD testing-denies sx and known exposure-NAD-steady gait

## 2024-05-24 NOTE — Discharge Instructions (Addendum)
 Testing for gonorrhea, chlamydia and trichomonas is pending. You declined blood work to test for HIV, syphilis and hepatitis today; however, you are welcomed to return to have these done or with your primary care provider/GYN. You should not have any sexual activity until you receive the results of the tests. You will only be notified for positive results. You may go online to MyChart and review your results. Practice safe sex practices by wearing a condom every time you have sex

## 2024-05-24 NOTE — ED Provider Notes (Signed)
 [facility]    CSN: [id number] Arrival date & time: 05/24/24  1255      History   Chief Complaint Chief Complaint  Patient presents with   SEXUALLY TRANSMITTED DISEASE    Entered by patient    HPI Andrea Salazar is a 28 y.o. female.   Andrea Salazar is a 28 y.o. female presents for sexually transmitted infection testing. She reports having a new female sexual partner and requests STI testing. The patient denies any current symptoms such as discharge, itching, or irritation. She states that she uses condoms most of the time with her partner. The patient reports having only one sexual partner in the past 3 months. The patient does not have regular menstrual periods due to having a nexplanon  in place.   The following portions of the patient's history were reviewed and updated as appropriate: allergies, current medications, past family history, past medical history, past social history, past surgical history, and problem list.    Past Medical History:  Diagnosis Date   Anxiety    Depression    per pt    There are no active problems to display for this patient.   Past Surgical History:  Procedure Laterality Date   EXTERNAL EAR SURGERY      OB History   No obstetric history on file.      Home Medications    Prior to Admission medications   Medication Sig Start Date End Date Taking? Authorizing Provider  FLUoxetine HCl 60 MG TABS Take 1 tablet by mouth daily. 01/09/24  Yes Andrea Salazar  hydrOXYzine (VISTARIL) 25 MG capsule Take 25 mg by mouth. 04/11/24  Yes Andrea Salazar  QUEtiapine (SEROQUEL) 50 MG tablet Take 50 mg by mouth. 04/21/22  Yes Andrea Salazar  valACYclovir (VALTREX) 500 MG tablet TAKE 1 TABLET BY MOUTH TWICE A DAY FOR 3 DAYS AS NEEDED 04/12/24  Yes Andrea Salazar  azelastine  (ASTELIN ) 0.1 % nasal spray Place 2 sprays into both nostrils 2 (two) times daily. Use in each nostril as directed Patient not taking:  Reported on 02/21/2019 09/12/17   Andrea Humphreys, PA  dextroamphetamine (DEXEDRINE SPANSULE) 15 MG 24 hr capsule Take 15 mg by mouth daily.    Andrea Salazar  etonogestrel  (NEXPLANON ) 68 MG IMPL implant     Andrea Salazar  Guaifenesin  (MUCINEX  MAXIMUM STRENGTH) 1200 MG TB12 Take 1 tablet (1,200 mg total) by mouth every 12 (twelve) hours as needed. Patient not taking: Reported on 02/21/2019 09/12/17   Andrea Humphreys, PA  Norgestimate-Ethinyl Estradiol Triphasic (ORTHO TRI-CYCLEN LO) 0.18/0.215/0.25 MG-25 MCG tablet Take 1 tablet by mouth daily.      Andrea Salazar    Family History No family history on file.  Social History Social History   Tobacco Use   Smoking status: Some Days    Types: Cigarettes   Smokeless tobacco: Never  Vaping Use   Vaping status: Never Used  Substance Use Topics   Alcohol use: Not Currently    Comment: weekly   Drug use: Yes    Types: Marijuana     Allergies   Patient has no known allergies.   Review of Systems Review of Systems  HENT:  Negative for mouth sores.   Genitourinary:  Negative for dysuria, genital sores, urgency, vaginal discharge and vaginal pain.  Musculoskeletal:  Negative for back pain.  All other systems reviewed and are negative.    Physical Exam Triage Vital Signs ED Triage Vitals  Encounter Vitals Group     BP 05/24/24 1311 104/70     Girls Systolic BP Percentile --      Girls Diastolic BP Percentile --      Boys Systolic BP Percentile --      Boys Diastolic BP Percentile --      Pulse Rate 05/24/24 1311 79     Resp 05/24/24 1311 16     Temp 05/24/24 1311 98.1 F (36.7 C)     Temp Source 05/24/24 1311 Oral     SpO2 05/24/24 1311 98 %     Weight --      Height --      Head Circumference --      Peak Flow --      Pain Score 05/24/24 1307 0     Pain Loc --      Pain Education --      Exclude from Growth Chart --    No data found.  Updated Vital Signs BP 104/70 (BP Location: Right  Arm)   Pulse 79   Temp 98.1 F (36.7 C) (Oral)   Resp 16   SpO2 98%   Visual Acuity Right Eye Distance:   Left Eye Distance:   Bilateral Distance:    Right Eye Near:   Left Eye Near:    Bilateral Near:     Physical Exam Constitutional:      General: She is not in acute distress.    Appearance: Normal appearance. She is not ill-appearing, toxic-appearing or diaphoretic.  HENT:     Head: Normocephalic.     Nose: Nose normal.     Mouth/Throat:     Mouth: Mucous membranes are moist.   Eyes:     Conjunctiva/sclera: Conjunctivae normal.    Cardiovascular:     Rate and Rhythm: Normal rate.  Pulmonary:     Effort: Pulmonary effort is normal.  Abdominal:     Palpations: Abdomen is soft.  Genitourinary:    Comments: Deferred; patient performed self-swab for Aptima testing   Musculoskeletal:        General: Normal range of motion.     Cervical back: Normal range of motion and neck supple.   Skin:    General: Skin is warm and dry.   Neurological:     General: No focal deficit present.     Mental Status: She is alert and oriented to person, place, and time.   Psychiatric:        Mood and Affect: Mood normal.        Behavior: Behavior normal.      UC Treatments / Results  Labs (all labs ordered are listed, but only abnormal results are displayed) Labs Reviewed  CERVICOVAGINAL ANCILLARY ONLY    EKG   Radiology No results found.  Procedures Procedures (including critical care time)  Medications Ordered in UC Medications - No data to display  Initial Impression / Assessment and Plan / UC Course  I have reviewed the triage vital signs and the nursing notes.  Pertinent labs & imaging results that were available during my care of the patient were reviewed by me and considered in my medical decision making (see chart for details).     Andrea Salazar is a 28 year old female who presents for routine sexually transmitted infection testing. She reports a  new female sexual partner and requests screening. She denies any current genitourinary symptoms, including discharge, itching, or irritation. The patient reports using condoms most of the time and  has had one sexual partner in the past three months. Her menstrual cycles are irregular due to the presence of a Nexplanon  implant. An Aptima swab was collected today to test for gonorrhea, chlamydia, and trichomoniasis. She declined blood testing for HIV, syphilis (RPR), and hepatitis, stating she plans to follow up with her established gynecologist later this year for additional screening. She was advised to monitor for any symptoms and follow up with her primary care provider or GYN if symptoms develop or if she changes sexual partners.   Today's evaluation has revealed no signs of a dangerous process. Discussed diagnosis with patient and/or guardian. Patient and/or guardian aware of their diagnosis, possible red flag symptoms to watch out for and need for close follow up. Patient and/or guardian understands verbal and written discharge instructions. Patient and/or guardian comfortable with plan and disposition.  Patient and/or guardian has a clear mental status at this time, good insight into illness (after discussion and teaching) and has clear judgment to make decisions regarding their care  Documentation was completed with the aid of voice recognition software. Transcription may contain typographical errors. Final Clinical Impressions(s) / UC Diagnoses   Final diagnoses:  Screening examination for STD (sexually transmitted disease)     Discharge Instructions      Testing for gonorrhea, chlamydia and trichomonas is pending. You declined blood work to test for HIV, syphilis and hepatitis today; however, you are welcomed to return to have these done or with your primary care provider/GYN. You should not have any sexual activity until you receive the results of the tests. You will only be notified for  positive results. You may go online to MyChart and review your results. Practice safe sex practices by wearing a condom every time you have sex     ED Prescriptions   None    PDMP not reviewed this encounter.   Maryruth Sol, [place] 05/24/24 1505

## 2024-05-27 LAB — CERVICOVAGINAL ANCILLARY ONLY
Chlamydia: NEGATIVE
Comment: NEGATIVE
Comment: NEGATIVE
Comment: NORMAL
Neisseria Gonorrhea: NEGATIVE
Trichomonas: POSITIVE — AB

## 2024-05-28 ENCOUNTER — Ambulatory Visit (HOSPITAL_COMMUNITY): Payer: Self-pay

## 2024-05-28 MED ORDER — FLUCONAZOLE 150 MG PO TABS: 150.0000 mg | ORAL_TABLET | Freq: Once | ORAL | 0 refills | Status: AC

## 2024-05-28 MED ORDER — METRONIDAZOLE 500 MG PO TABS: 500.0000 mg | ORAL_TABLET | Freq: Two times a day (BID) | ORAL | 0 refills | Status: AC
